# Patient Record
Sex: Male | Born: 1970 | Race: White | Hispanic: No | Marital: Single | Smoking: Never smoker
Health system: Southern US, Community
[De-identification: ages and names within clinical notes are randomized; demographics above are authoritative.]

## PROBLEM LIST (undated history)

## (undated) DIAGNOSIS — I10 Essential (primary) hypertension: Secondary | ICD-10-CM

## (undated) DIAGNOSIS — G8929 Other chronic pain: Secondary | ICD-10-CM

## (undated) DIAGNOSIS — M549 Dorsalgia, unspecified: Secondary | ICD-10-CM

---

## 2009-04-23 ENCOUNTER — Emergency Department: Payer: Self-pay | Admitting: Emergency Medicine

## 2009-06-12 ENCOUNTER — Emergency Department: Payer: Self-pay | Admitting: Internal Medicine

## 2011-08-15 ENCOUNTER — Emergency Department: Payer: Self-pay | Admitting: Emergency Medicine

## 2011-08-24 ENCOUNTER — Emergency Department: Payer: Self-pay | Admitting: Emergency Medicine

## 2012-08-01 ENCOUNTER — Emergency Department: Payer: Self-pay | Admitting: Emergency Medicine

## 2012-08-07 ENCOUNTER — Emergency Department: Payer: Self-pay | Admitting: Emergency Medicine

## 2013-05-17 ENCOUNTER — Emergency Department: Payer: Self-pay | Admitting: Internal Medicine

## 2013-06-10 ENCOUNTER — Emergency Department: Payer: Self-pay | Admitting: Emergency Medicine

## 2013-08-12 ENCOUNTER — Emergency Department: Payer: Self-pay | Admitting: Internal Medicine

## 2013-08-23 ENCOUNTER — Emergency Department (HOSPITAL_COMMUNITY)
Admission: EM | Admit: 2013-08-23 | Discharge: 2013-08-23 | Disposition: A | Discharge status: Home / Self Care | Payer: Self-pay | Attending: Emergency Medicine | Admitting: Emergency Medicine

## 2013-08-23 ENCOUNTER — Emergency Department (HOSPITAL_COMMUNITY): Payer: Self-pay

## 2013-08-23 ENCOUNTER — Encounter (HOSPITAL_COMMUNITY): Payer: Self-pay | Admitting: Emergency Medicine

## 2013-08-23 DIAGNOSIS — G8929 Other chronic pain: Secondary | ICD-10-CM | POA: Insufficient documentation

## 2013-08-23 DIAGNOSIS — M47816 Spondylosis without myelopathy or radiculopathy, lumbar region: Secondary | ICD-10-CM

## 2013-08-23 DIAGNOSIS — M47817 Spondylosis without myelopathy or radiculopathy, lumbosacral region: Secondary | ICD-10-CM | POA: Insufficient documentation

## 2013-08-23 HISTORY — DX: Dorsalgia, unspecified: M54.9

## 2013-08-23 HISTORY — DX: Other chronic pain: G89.29

## 2013-08-23 MED ORDER — DIAZEPAM 5 MG PO TABS
5.0000 mg | ORAL_TABLET | Freq: Once | ORAL | Status: AC
  Administered 2013-08-23: 5 mg via ORAL
  Filled 2013-08-23: qty 1

## 2013-08-23 MED ORDER — BACLOFEN 10 MG PO TABS: 10.0000 mg | ORAL_TABLET | Freq: Three times a day (TID) | ORAL | Status: AC

## 2013-08-23 MED ORDER — DICLOFENAC SODIUM 75 MG PO TBEC: 75.0000 mg | DELAYED_RELEASE_TABLET | Freq: Two times a day (BID) | ORAL | Status: DC

## 2013-08-23 MED ORDER — KETOROLAC TROMETHAMINE 10 MG PO TABS
10.0000 mg | ORAL_TABLET | Freq: Once | ORAL | Status: AC
  Administered 2013-08-23: 10 mg via ORAL
  Filled 2013-08-23: qty 1

## 2013-08-23 MED ORDER — PREDNISONE 50 MG PO TABS
60.0000 mg | ORAL_TABLET | Freq: Once | ORAL | Status: AC
  Administered 2013-08-23: 60 mg via ORAL
  Filled 2013-08-23 (×2): qty 1

## 2013-08-23 MED ORDER — DEXAMETHASONE 4 MG PO TABS: ORAL_TABLET | ORAL | Status: DC

## 2013-08-23 NOTE — Discharge Instructions (Signed)
The x-ray of your lower back reveals changes of the disc space, as well as arthritis being present. Your examination reveals some mild to moderate spasm in various areas of your lower back. Please rest your back is much as possible. Please apply heat to your lower back. Please use medications as prescribed. Baclofen may cause drowsiness, please use with caution. Please take these medications with food. Please see the physicians at the adult medicine clinic to provide medical assistance until you can establish a primary physician.

## 2013-08-23 NOTE — ED Provider Notes (Signed)
CSN: [id number]     Arrival date & time 08/23/13  1920 History   First MD Initiated Contact with Patient 08/23/13 2153     Chief Complaint  Patient presents with  . Back Pain   (Consider location/radiation/quality/duration/timing/severity/associated sxs/prior Treatment) Patient is a 43 y.o. male presenting with back pain. The history is provided by the patient.  Back Pain Location:  Lumbar spine Quality:  Aching Associated symptoms: no abdominal pain, no chest pain and no dysuria     Past Medical History  Diagnosis Date  . Chronic back pain    History reviewed. No pertinent past surgical history. History reviewed. No pertinent family history. History  Substance Use Topics  . Smoking status: Never Smoker   . Smokeless tobacco: Not on file  . Alcohol Use: Yes     Comment: occasional    Review of Systems  Constitutional: Negative for activity change.       All ROS Neg except as noted in HPI  HENT: Negative for nosebleeds.   Eyes: Negative for photophobia and discharge.  Respiratory: Negative for cough, shortness of breath and wheezing.   Cardiovascular: Negative for chest pain and palpitations.  Gastrointestinal: Negative for abdominal pain and blood in stool.  Genitourinary: Negative for dysuria, frequency and hematuria.  Musculoskeletal: Positive for back pain. Negative for arthralgias and neck pain.  Skin: Negative.   Neurological: Negative for dizziness, seizures and speech difficulty.  Psychiatric/Behavioral: Negative for hallucinations and confusion.    Allergies  Review of patient's allergies indicates no known allergies.  Home Medications   Current Outpatient Rx  Name  Route  Sig  Dispense  Refill  . traMADol (ULTRAM) 50 MG tablet   Oral   Take 50 mg by mouth every 6 (six) hours as needed.          BP 146/60  Pulse 83  Temp(Src) 98.2 F (36.8 C)  Resp 20  Ht 5\' 9"  (1.753 m)  Wt 200 lb (90.719 kg)  BMI 29.52 kg/m2  SpO2 99% Physical Exam  Nursing  note and vitals reviewed. Constitutional: He is oriented to person, place, and time. He appears well-developed and well-nourished.  Non-toxic appearance.  HENT:  Head: Normocephalic.  Right Ear: Tympanic membrane and external ear normal.  Left Ear: Tympanic membrane and external ear normal.  Eyes: EOM and lids are normal. Pupils are equal, round, and reactive to light.  Neck: Normal range of motion. Neck supple. Carotid bruit is not present.  Cardiovascular: Normal rate, regular rhythm, normal heart sounds, intact distal pulses and normal pulses.   Pulmonary/Chest: Breath sounds normal. No respiratory distress.  Abdominal: Soft. Bowel sounds are normal. There is no tenderness. There is no guarding.  Musculoskeletal:       Lumbar back: He exhibits decreased range of motion, tenderness and spasm.  Lymphadenopathy:       Head (right side): No submandibular adenopathy present.       Head (left side): No submandibular adenopathy present.    He has no cervical adenopathy.  Neurological: He is alert and oriented to person, place, and time. He has normal strength. No cranial nerve deficit or sensory deficit.  Skin: Skin is warm and dry.  Psychiatric: He has a normal mood and affect. His speech is normal.    ED Course  Procedures (including critical care time) Labs Review Labs Reviewed - No data to display Imaging Review Dg Lumbar Spine Complete  08/23/2013   CLINICAL DATA:  Low back pain  EXAM: LUMBAR  SPINE - COMPLETE 4+ VIEW  COMPARISON:  August 12, 2013  FINDINGS: Frontal, lateral, spot lumbosacral lateral, and bilateral oblique views were obtained. There are 5 non-rib-bearing lumbar type vertebral bodies. There is no fracture or spondylolisthesis. There is moderate disc space narrowing at L5-S1. Other disc spaces appear intact. There is facet osteoarthritic change at L5-S1 bilaterally.  IMPRESSION: Osteoarthritic change at L5-S1.  No fracture or spondylolisthesis.   Electronically Signed   By:  Bretta Bang M.D.   On: 08/23/2013 21:45    EKG Interpretation   None       MDM  No diagnosis found. *I have reviewed nursing notes, vital signs, and all appropriate lab and imaging results for this patient.**  Xray of the lumbar spine reveals narrowing at the L5S1 area, with facet osteoarthritis. Plan - Rx for baclofen, decadron, and voltaren. Pt to follow up with PCP for management of this problem. Test results and plan discussed with the patient.  Kathie Dike, PA-C 08/25/13 1730

## 2013-08-23 NOTE — ED Notes (Addendum)
Pt reporting pain in lower back moving up right side and into neck.  Reports long history of pain, worse in past few days.  Reports being told that he had sciatica and a bone spur at [facility] a couple weeks ago.  Reports he was given ultram and an antiinflammatory and denies relief from either.

## 2013-08-28 NOTE — ED Provider Notes (Signed)
Medical screening examination/treatment/procedure(s) were performed by non-physician practitioner and as supervising physician I was immediately available for consultation/collaboration.  EKG Interpretation   None         Chakia Counts L Genora Arp, MD 08/28/13 1534 

## 2013-10-29 LAB — ETHANOL: Ethanol %: <0.003 % (ref 0.000–0.080)

## 2013-10-29 LAB — COMPREHENSIVE METABOLIC PANEL
ALBUMIN: 4.5 g/dL (ref 3.4–5.0)
ALK PHOS: 92 U/L
Anion Gap: 6 — ABNORMAL LOW (ref 7–16)
BILIRUBIN TOTAL: 0.5 mg/dL (ref 0.2–1.0)
BUN: 12 mg/dL (ref 7–18)
CALCIUM: 9.2 mg/dL (ref 8.5–10.1)
CREATININE: 0.9 mg/dL (ref 0.60–1.30)
Chloride: 108 mmol/L — ABNORMAL HIGH (ref 98–107)
Co2: 27 mmol/L (ref 21–32)
EGFR (African American): >60
EGFR (Non-African Amer.): >60
Glucose: 99 mg/dL (ref 65–99)
OSMOLALITY: 281 (ref 275–301)
POTASSIUM: 4.2 mmol/L (ref 3.5–5.1)
SGOT(AST): 20 U/L (ref 15–37)
SGPT (ALT): 39 U/L (ref 12–78)
Sodium: 141 mmol/L (ref 136–145)
TOTAL PROTEIN: 8.3 g/dL — AB (ref 6.4–8.2)

## 2013-10-29 LAB — CBC
HCT: 49.4 % (ref 40.0–52.0)
HGB: 16.4 g/dL (ref 13.0–18.0)
MCH: 31.8 pg (ref 26.0–34.0)
MCHC: 33.2 g/dL (ref 32.0–36.0)
MCV: 96 fL (ref 80–100)
Platelet: 233 10*3/uL (ref 150–440)
RBC: 5.17 10*6/uL (ref 4.40–5.90)
RDW: 13.9 % (ref 11.5–14.5)
WBC: 8 10*3/uL (ref 3.8–10.6)

## 2013-10-29 LAB — URINALYSIS, COMPLETE
BACTERIA: NONE SEEN
Bilirubin,UR: NEGATIVE
Glucose,UR: NEGATIVE mg/dL (ref 0–75)
Ketone: NEGATIVE
LEUKOCYTE ESTERASE: NEGATIVE
Nitrite: NEGATIVE
Ph: 5 (ref 4.5–8.0)
Protein: NEGATIVE
RBC,UR: <1 /HPF (ref 0–5)
SPECIFIC GRAVITY: 1.02 (ref 1.003–1.030)
Squamous Epithelial: NONE SEEN
WBC UR: 5 /HPF (ref 0–5)

## 2013-10-29 LAB — DRUG SCREEN, URINE
Amphetamines, Ur Screen: NEGATIVE (ref ?–1000)
Barbiturates, Ur Screen: NEGATIVE (ref ?–200)
Benzodiazepine, Ur Scrn: POSITIVE (ref ?–200)
CANNABINOID 50 NG, UR ~~LOC~~: POSITIVE (ref ?–50)
Cocaine Metabolite,Ur ~~LOC~~: NEGATIVE (ref ?–300)
MDMA (Ecstasy)Ur Screen: NEGATIVE (ref ?–500)
Methadone, Ur Screen: NEGATIVE (ref ?–300)
Opiate, Ur Screen: NEGATIVE (ref ?–300)
Phencyclidine (PCP) Ur S: NEGATIVE (ref ?–25)
TRICYCLIC, UR SCREEN: NEGATIVE (ref ?–1000)

## 2013-10-29 LAB — SALICYLATE LEVEL: SALICYLATES, SERUM: 3.2 mg/dL — AB

## 2013-10-29 LAB — ACETAMINOPHEN LEVEL

## 2013-10-30 ENCOUNTER — Inpatient Hospital Stay: Payer: Self-pay | Admitting: Psychiatry

## 2013-12-31 ENCOUNTER — Emergency Department: Payer: Self-pay | Admitting: Emergency Medicine

## 2014-07-17 ENCOUNTER — Encounter (HOSPITAL_COMMUNITY): Payer: Self-pay | Admitting: Emergency Medicine

## 2014-07-17 ENCOUNTER — Emergency Department (HOSPITAL_COMMUNITY)
Admission: EM | Admit: 2014-07-17 | Discharge: 2014-07-17 | Disposition: A | Discharge status: Home / Self Care | Payer: Self-pay | Attending: Emergency Medicine | Admitting: Emergency Medicine

## 2014-07-17 DIAGNOSIS — M25511 Pain in right shoulder: Secondary | ICD-10-CM | POA: Insufficient documentation

## 2014-07-17 DIAGNOSIS — Z791 Long term (current) use of non-steroidal anti-inflammatories (NSAID): Secondary | ICD-10-CM | POA: Insufficient documentation

## 2014-07-17 DIAGNOSIS — M62838 Other muscle spasm: Secondary | ICD-10-CM

## 2014-07-17 DIAGNOSIS — G8929 Other chronic pain: Secondary | ICD-10-CM | POA: Insufficient documentation

## 2014-07-17 DIAGNOSIS — M5412 Radiculopathy, cervical region: Secondary | ICD-10-CM | POA: Insufficient documentation

## 2014-07-17 DIAGNOSIS — G8911 Acute pain due to trauma: Secondary | ICD-10-CM | POA: Insufficient documentation

## 2014-07-17 DIAGNOSIS — Z7952 Long term (current) use of systemic steroids: Secondary | ICD-10-CM | POA: Insufficient documentation

## 2014-07-17 DIAGNOSIS — M62830 Muscle spasm of back: Secondary | ICD-10-CM | POA: Insufficient documentation

## 2014-07-17 MED ORDER — CYCLOBENZAPRINE HCL 10 MG PO TABS: 10.0000 mg | ORAL_TABLET | Freq: Two times a day (BID) | ORAL | Status: DC | PRN

## 2014-07-17 MED ORDER — OXYCODONE-ACETAMINOPHEN 5-325 MG PO TABS: 2.0000 | ORAL_TABLET | Freq: Four times a day (QID) | ORAL | Status: DC | PRN

## 2014-07-17 MED ORDER — NAPROXEN 250 MG PO TABS
500.0000 mg | ORAL_TABLET | Freq: Once | ORAL | Status: AC
  Administered 2014-07-17: 500 mg via ORAL
  Filled 2014-07-17: qty 2

## 2014-07-17 MED ORDER — NAPROXEN 500 MG PO TABS: 500.0000 mg | ORAL_TABLET | Freq: Two times a day (BID) | ORAL | Status: DC

## 2014-07-17 MED ORDER — OXYCODONE-ACETAMINOPHEN 5-325 MG PO TABS
2.0000 | ORAL_TABLET | Freq: Once | ORAL | Status: AC
  Administered 2014-07-17: 2 via ORAL
  Filled 2014-07-17: qty 2

## 2014-07-17 NOTE — ED Notes (Signed)
Patient placed on 5 lead 

## 2014-07-17 NOTE — Discharge Instructions (Signed)
Call an orthopedic specialist for further evaluation of your shoulder and neck pain. Call for a follow up appointment with a Family or Primary Care Provider.  Return if Symptoms worsen.   Take medication as prescribed.  Ice and warm compresses intermittently to decrease symptoms. Do not operate heavy machinery, drink alcohol while taking narcotic or muscle relaxant medications.

## 2014-07-17 NOTE — ED Notes (Signed)
Pt c/o right shoulder pain and neck pain x 3 weeks after injuring while lifting heavy furniture; pt sts some tingling in fingers; CMS intact

## 2014-07-17 NOTE — ED Provider Notes (Signed)
CSN: [id number]     Arrival date & time 07/17/14  [id number] History   First MD Initiated Contact with Patient 07/17/14 1017     Chief Complaint  Patient presents with  . Shoulder Pain  . Neck Pain     (Consider location/radiation/quality/duration/timing/severity/associated sxs/prior Treatment) HPI Comments: The patient is a 43 year old male presents emergency room chief complaint of right neck, shoulder and arm discomfort for 2 weeks. Patient reports pain worsened with movement. He reports occasional numbness into the right upper extremity. Patient is right-hand dominant. Patient denies dropping anything due to numbness or weakness. Patient reports taken anti-inflammatories without relief.  Patient also reports history of right shoulder injury, with crepitus increase in pain with movement, has not seen orthopedist. Patient reports knot in right upper back.  The history is provided by the patient. No language interpreter was used.    Past Medical History  Diagnosis Date  . Chronic back pain    History reviewed. No pertinent past surgical history. History reviewed. No pertinent family history. History  Substance Use Topics  . Smoking status: Never Smoker   . Smokeless tobacco: Not on file  . Alcohol Use: Yes     Comment: occasional    Review of Systems  Musculoskeletal: Positive for arthralgias, neck pain and neck stiffness.  Neurological: Positive for numbness.      Allergies  Review of patient's allergies indicates no known allergies.  Home Medications   Prior to Admission medications   Medication Sig Start Date End Date Taking? Authorizing Provider  dexamethasone (DECADRON) 4 MG tablet 1 po bid with food 08/23/13   Kathie DikeHobson M Bryant, PA-C  diclofenac (VOLTAREN) 75 MG EC tablet Take 1 tablet (75 mg total) by mouth 2 (two) times daily. 08/23/13   Kathie DikeHobson M Bryant, PA-C  traMADol (ULTRAM) 50 MG tablet Take 50 mg by mouth every 6 (six) hours as needed.    Historical Provider, MD    BP 120/58 mmHg  Pulse 65  Temp(Src) 97.8 F (36.6 C) (Oral)  Resp 19  SpO2 96% Physical Exam  Constitutional: He is oriented to person, place, and time. He appears well-developed and well-nourished. No distress.  HENT:  Head: Normocephalic and atraumatic.  Neck: Neck supple.  Cardiovascular:  Pulses:      Radial pulses are 2+ on the right side, and 2+ on the left side.  Pulmonary/Chest: Effort normal. No respiratory distress.  Musculoskeletal:       Cervical back: He exhibits tenderness and spasm.       Back:  Right upper trap with tenderness with palpation, spasm noted. Palpation of right trapezius greatest discomfort in upper extremity. Positive Spurling's.  Neurological: He is oriented to person, place, and time.  Reflex Scores:      Bicep reflexes are 2+ on the right side and 2+ on the left side. Equal sensation in bilateral upper extremities.  Skin: Skin is warm and dry.  Psychiatric: He has a normal mood and affect. His behavior is normal.  Nursing note and vitals reviewed.   ED Course  Procedures (including critical care time) Labs Review Labs Reviewed - No data to display  Imaging Review No results found.   EKG Interpretation None      MDM   Final diagnoses:  Cervical radiculopathy  Muscle spasm   Patient presents with right neck and right shoulder pain, reproducible with palpation likely spasm and likely radicular pain. Plan to treat with anti-inflammatory, narcotic pain medication, muscle relaxer, ortho follow-up.  Meds given  in ED:  Medications  naproxen (NAPROSYN) tablet 500 mg (500 mg Oral Given 07/17/14 1036)  oxyCODONE-acetaminophen (PERCOCET/ROXICET) 5-325 MG per tablet 2 tablet (2 tablets Oral Given 07/17/14 1036)    Discharge Medication List as of 07/17/2014 10:55 AM    START taking these medications   Details  cyclobenzaprine (FLEXERIL) 10 MG tablet Take 1 tablet (10 mg total) by mouth 2 (two) times daily as needed for muscle  spasms., Starting 07/17/2014, Until Discontinued, Print    naproxen (NAPROSYN) 500 MG tablet Take 1 tablet (500 mg total) by mouth 2 (two) times daily with a meal., Starting 07/17/2014, Until Discontinued, Print    oxyCODONE-acetaminophen (PERCOCET/ROXICET) 5-325 MG per tablet Take 2 tablets by mouth every 6 (six) hours as needed for severe pain., Starting 07/17/2014, Until Discontinued, Print         Mellody DrownLauren Kyira Volkert, PA-C 07/17/14 1501  Flint MelterElliott L Wentz, MD 07/17/14 [phone]

## 2014-07-17 NOTE — ED Notes (Signed)
Pt comfortable with discharge and follow up instructions. Pt declines wheelchair, escorted to waiting area by this RN. Prescriptions x3. 
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
## 2015-11-12 ENCOUNTER — Emergency Department
Admission: EM | Admit: 2015-11-12 | Discharge: 2015-11-12 | Disposition: A | Discharge status: Home / Self Care | Payer: Self-pay | Attending: Emergency Medicine | Admitting: Emergency Medicine

## 2015-11-12 ENCOUNTER — Emergency Department: Payer: Self-pay

## 2015-11-12 ENCOUNTER — Encounter: Payer: Self-pay | Admitting: *Deleted

## 2015-11-12 DIAGNOSIS — Y999 Unspecified external cause status: Secondary | ICD-10-CM | POA: Insufficient documentation

## 2015-11-12 DIAGNOSIS — S62335A Displaced fracture of neck of fourth metacarpal bone, left hand, initial encounter for closed fracture: Secondary | ICD-10-CM | POA: Insufficient documentation

## 2015-11-12 DIAGNOSIS — Y929 Unspecified place or not applicable: Secondary | ICD-10-CM | POA: Insufficient documentation

## 2015-11-12 DIAGNOSIS — Y939 Activity, unspecified: Secondary | ICD-10-CM | POA: Insufficient documentation

## 2015-11-12 DIAGNOSIS — S62339A Displaced fracture of neck of unspecified metacarpal bone, initial encounter for closed fracture: Secondary | ICD-10-CM

## 2015-11-12 DIAGNOSIS — Z791 Long term (current) use of non-steroidal anti-inflammatories (NSAID): Secondary | ICD-10-CM | POA: Insufficient documentation

## 2015-11-12 MED ORDER — MELOXICAM 15 MG PO TABS: 15.0000 mg | ORAL_TABLET | Freq: Every day | ORAL | Status: DC

## 2015-11-12 MED ORDER — TRAMADOL HCL 50 MG PO TABS
50.0000 mg | ORAL_TABLET | Freq: Four times a day (QID) | ORAL | Status: DC | PRN
Start: 2015-11-12 — End: 2018-02-25
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
## 2015-11-12 NOTE — Discharge Instructions (Signed)
It is very important that you call and schedule a follow up appointment with a hand surgeon as soon as possible.
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
## 2015-11-12 NOTE — ED Notes (Signed)
Pt discharged home after verbalizing understanding of discharge instructions; nad noted. 
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
## 2016-05-05 ENCOUNTER — Emergency Department
Admission: EM | Admit: 2016-05-05 | Discharge: 2016-05-05 | Disposition: A | Discharge status: Home / Self Care | Payer: Self-pay | Attending: Emergency Medicine | Admitting: Emergency Medicine

## 2016-05-05 ENCOUNTER — Emergency Department: Payer: Self-pay

## 2016-05-05 DIAGNOSIS — R519 Headache, unspecified: Secondary | ICD-10-CM

## 2016-05-05 DIAGNOSIS — R51 Headache: Secondary | ICD-10-CM | POA: Insufficient documentation

## 2016-05-05 DIAGNOSIS — R112 Nausea with vomiting, unspecified: Secondary | ICD-10-CM | POA: Insufficient documentation

## 2016-05-05 DIAGNOSIS — Z791 Long term (current) use of non-steroidal anti-inflammatories (NSAID): Secondary | ICD-10-CM | POA: Insufficient documentation

## 2016-05-05 MED ORDER — BUTALBITAL-APAP-CAFFEINE 50-325-40 MG PO TABS
1.0000 | ORAL_TABLET | Freq: Once | ORAL | Status: AC
  Administered 2016-05-05: 1 via ORAL
  Filled 2016-05-05: qty 1

## 2016-05-05 MED ORDER — BUTALBITAL-APAP-CAFFEINE 50-325-40 MG PO TABS
1.0000 | ORAL_TABLET | Freq: Four times a day (QID) | ORAL | 0 refills | Status: AC | PRN
Start: 2016-05-05 — End: 2017-05-05

## 2016-05-05 NOTE — ED Triage Notes (Signed)
Pt c/o HA with N/V for the past 4 days.. States he has been taking OTC tylenol, aleve and goody powders with no relief..Marland Kitchen

## 2016-05-05 NOTE — Discharge Instructions (Signed)
Please seek medical attention for any high fevers, chest pain, shortness of breath, change in behavior, persistent vomiting, bloody stool or any other new or concerning symptoms.  

## 2016-05-05 NOTE — ED Notes (Signed)
Pt alert and oriented X4, active, cooperative, pt in NAD. RR even and unlabored, color WNL.  Pt informed to return if any life threatening symptoms occur.   

## 2016-05-05 NOTE — ED Provider Notes (Signed)
[facility] Emergency Department Provider Note    _____________________[facility]_______________________   I have reviewed the triage vital signs and the nursing notes.   HISTORY  Chief Complaint Headache; Nausea; and Emesis   History limited by: Not Limited   HPI Kevin Holt is a 45 y.o. male who presents to the emergency department today because of concern for headache. The patient states that it started four days ago and has progressively gotten worse. It is located in the back of his head with some radiation up the right side. He has tried medication without relief. He feels light sensitivity. Additionally he has had some nausea and a couple of episodes of vomiting. No fevers.   Past Medical History:  Diagnosis Date  . Chronic back pain     There are no active problems to display for this patient.   History reviewed. No pertinent surgical history.  Prior to Admission medications   Medication Sig Start Date End Date Taking? Authorizing Provider  acetaminophen (TYLENOL) 500 MG tablet Take 1,000 mg by mouth every 6 (six) hours as needed for mild pain or moderate pain.    Historical Provider, MD  cyclobenzaprine (FLEXERIL) 10 MG tablet Take 1 tablet (10 mg total) by mouth 2 (two) times daily as needed for muscle spasms. 07/17/14   Mellody DrownLauren Parker, PA-C  meloxicam (MOBIC) 15 MG tablet Take 1 tablet (15 mg total) by mouth daily. 11/12/15   Chinita Pesterari B Triplett, FNP  naproxen (NAPROSYN) 500 MG tablet Take 1 tablet (500 mg total) by mouth 2 (two) times daily with a meal. 07/17/14   Mellody DrownLauren Parker, PA-C  oxyCODONE-acetaminophen (PERCOCET/ROXICET) 5-325 MG per tablet Take 2 tablets by mouth every 6 (six) hours as needed for severe pain. 07/17/14   Mellody DrownLauren Parker, PA-C  traMADol (ULTRAM) 50 MG tablet Take 1 tablet (50 mg total) by mouth every 6 (six) hours as needed. 11/12/15   Chinita Pesterari B Triplett, FNP    Allergies Tramadol  No family history on file.  Social History Social  History  Substance Use Topics  . Smoking status: Never Smoker  . Smokeless tobacco: Never Used  . Alcohol use Yes     Comment: occasional    Review of Systems  Constitutional: Negative for fever. Cardiovascular: Negative for chest pain. Respiratory: Negative for shortness of breath. Gastrointestinal: Negative for abdominal pain, vomiting and diarrhea. Genitourinary: Negative for dysuria. Musculoskeletal: Negative for back pain. Skin: Negative for rash. Neurological: Positive for headaches.  10-point ROS otherwise negative.  ____________________________________________   PHYSICAL EXAM:  VITAL SIGNS: ED Triage Vitals [05/05/16 1213]  Enc Vitals Group     BP 129/85     Pulse Rate 86     Resp 18     Temp 97.7 F (36.5 C)     Temp Source Oral     SpO2 97 %     Weight 210 lb (95.3 kg)     Height 5\' 9"  (1.753 m)     Head Circumference      Peak Flow      Pain Score 10   Constitutional: Alert and oriented. Well appearing and in no distress. Eyes: Conjunctivae are normal. Normal extraocular movements. ENT   Head: Normocephalic and atraumatic.   Nose: No congestion/rhinnorhea.   Mouth/Throat: Mucous membranes are moist.   Neck: No stridor. Hematological/Lymphatic/Immunilogical: No cervical lymphadenopathy. Cardiovascular: Normal rate, regular rhythm.  No murmurs, rubs, or gallops.  Respiratory: Normal respiratory effort without tachypnea nor retractions. Breath sounds are clear and equal  bilaterally. No wheezes/rales/rhonchi. Gastrointestinal: Soft and nontender. No distention.  Genitourinary: Deferred Musculoskeletal: Normal range of motion in all extremities. No lower extremity edema. Neurologic:  Normal speech and language. No gross focal neurologic deficits are appreciated.  Skin:  Skin is warm, dry and intact. No rash noted. Psychiatric: Mood and affect are normal. Speech and behavior are normal. Patient exhibits appropriate insight and  judgment.  ____________________________________________    LABS (pertinent positives/negatives)  Labs Reviewed - No data to display   ____________________________________________   EKG  None  ____________________________________________    RADIOLOGY  CT head IMPRESSION:  No acute finding.    Chronic appearing lacune in the right putamen.     ___________________________________________   PROCEDURES  Procedures  ____________________________________________   INITIAL IMPRESSION / ASSESSMENT AND PLAN / ED COURSE  Pertinent labs & imaging results that were available during my care of the patient were reviewed by me and considered in my medical decision making (see chart for details).  Patient with headache for four days. CT head without acute finding. Patient states that the medication did help take the edge off of the pain. Felt comfortable going home with prescription for pain medication.   ____________________________________________   FINAL CLINICAL IMPRESSION(S) / ED DIAGNOSES  Final diagnoses:  Nonintractable headache, unspecified chronicity pattern, unspecified headache type     Note: This dictation was prepared with Dragon dictation. Any transcriptional errors that result from this process are unintentional    Phineas Semen, MD 05/05/16 1945

## 2018-02-25 ENCOUNTER — Emergency Department
Admission: EM | Admit: 2018-02-25 | Discharge: 2018-02-25 | Disposition: A | Discharge status: Home / Self Care | Payer: No Typology Code available for payment source | Attending: Emergency Medicine | Admitting: Emergency Medicine

## 2018-02-25 ENCOUNTER — Emergency Department: Payer: No Typology Code available for payment source

## 2018-02-25 ENCOUNTER — Other Ambulatory Visit: Payer: Self-pay

## 2018-02-25 DIAGNOSIS — T300 Burn of unspecified body region, unspecified degree: Secondary | ICD-10-CM | POA: Insufficient documentation

## 2018-02-25 DIAGNOSIS — Y9389 Activity, other specified: Secondary | ICD-10-CM | POA: Diagnosis not present

## 2018-02-25 DIAGNOSIS — M7918 Myalgia, other site: Secondary | ICD-10-CM | POA: Diagnosis present

## 2018-02-25 DIAGNOSIS — Y998 Other external cause status: Secondary | ICD-10-CM | POA: Diagnosis not present

## 2018-02-25 DIAGNOSIS — Y92410 Unspecified street and highway as the place of occurrence of the external cause: Secondary | ICD-10-CM | POA: Insufficient documentation

## 2018-02-25 MED ORDER — SILVER SULFADIAZINE 1 % EX CREA
TOPICAL_CREAM | Freq: Once | CUTANEOUS | Status: AC
  Administered 2018-02-25: 13:00:00 via TOPICAL

## 2018-02-25 MED ORDER — OXYCODONE-ACETAMINOPHEN 5-325 MG PO TABS: 1.0000 | ORAL_TABLET | Freq: Three times a day (TID) | ORAL | 0 refills | Status: AC | PRN

## 2018-02-25 MED ORDER — BACITRACIN-NEOMYCIN-POLYMYXIN 400-5-5000 EX OINT
TOPICAL_OINTMENT | Freq: Once | CUTANEOUS | Status: AC
  Administered 2018-02-25: 13:00:00 via TOPICAL
  Filled 2018-02-25: qty 4

## 2018-02-25 MED ORDER — IBUPROFEN 600 MG PO TABS: 600.0000 mg | ORAL_TABLET | Freq: Four times a day (QID) | ORAL | 0 refills | Status: DC | PRN

## 2018-02-25 MED ORDER — CYCLOBENZAPRINE HCL 5 MG PO TABS: ORAL_TABLET | ORAL | 0 refills | Status: DC

## 2018-02-25 MED ORDER — OXYCODONE-ACETAMINOPHEN 5-325 MG PO TABS
1.0000 | ORAL_TABLET | Freq: Once | ORAL | Status: AC
  Administered 2018-02-25: 1 via ORAL
  Filled 2018-02-25: qty 1

## 2018-02-25 MED ORDER — SILVER SULFADIAZINE 1 % EX CREA
TOPICAL_CREAM | CUTANEOUS | Status: AC
  Filled 2018-02-25: qty 85

## 2018-02-25 MED ORDER — CEPHALEXIN 500 MG PO CAPS: 500.0000 mg | ORAL_CAPSULE | Freq: Four times a day (QID) | ORAL | 0 refills | Status: AC

## 2018-02-25 NOTE — Discharge Instructions (Addendum)
Please follow up with wound care next week for continued care of wounds. Please change dressings every 24 hours and keep wounds covered at all times.
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
## 2018-02-25 NOTE — ED Notes (Signed)
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
## 2019-01-08 ENCOUNTER — Emergency Department: Payer: Self-pay

## 2019-01-08 ENCOUNTER — Other Ambulatory Visit: Payer: Self-pay

## 2019-01-08 ENCOUNTER — Encounter: Payer: Self-pay | Admitting: Emergency Medicine

## 2019-01-08 ENCOUNTER — Emergency Department
Admission: EM | Admit: 2019-01-08 | Discharge: 2019-01-08 | Disposition: A | Discharge status: Home / Self Care | Payer: Self-pay | Attending: Emergency Medicine | Admitting: Emergency Medicine

## 2019-01-08 DIAGNOSIS — R0602 Shortness of breath: Secondary | ICD-10-CM | POA: Insufficient documentation

## 2019-01-08 DIAGNOSIS — Z5321 Procedure and treatment not carried out due to patient leaving prior to being seen by health care provider: Secondary | ICD-10-CM | POA: Insufficient documentation

## 2019-01-08 DIAGNOSIS — R079 Chest pain, unspecified: Secondary | ICD-10-CM | POA: Insufficient documentation

## 2019-01-08 HISTORY — DX: Essential (primary) hypertension: I10

## 2019-01-08 LAB — CBC
HCT: 50.8 % (ref 39.0–52.0)
Hemoglobin: 17.8 g/dL — ABNORMAL HIGH (ref 13.0–17.0)
MCH: 32.1 pg (ref 26.0–34.0)
MCHC: 35 g/dL (ref 30.0–36.0)
MCV: 91.7 fL (ref 80.0–100.0)
Platelets: 270 10*3/uL (ref 150–400)
RBC: 5.54 MIL/uL (ref 4.22–5.81)
RDW: 12.7 % (ref 11.5–15.5)
WBC: 9.4 10*3/uL (ref 4.0–10.5)
nRBC: 0 % (ref 0.0–0.2)

## 2019-01-08 LAB — BASIC METABOLIC PANEL
Anion gap: 13 (ref 5–15)
BUN: 12 mg/dL (ref 6–20)
CO2: 18 mmol/L — ABNORMAL LOW (ref 22–32)
Calcium: 9.2 mg/dL (ref 8.9–10.3)
Chloride: 107 mmol/L (ref 98–111)
Creatinine, Ser: 0.83 mg/dL (ref 0.61–1.24)
GFR calc Af Amer: >60 mL/min (ref >60)
GFR calc non Af Amer: >60 mL/min (ref >60)
Glucose, Bld: 115 mg/dL — ABNORMAL HIGH (ref 70–99)
Potassium: 3.6 mmol/L (ref 3.5–5.1)
Sodium: 138 mmol/L (ref 135–145)

## 2019-01-08 LAB — TROPONIN I: Troponin I: <0.03 ng/mL (ref <0.03)

## 2019-01-08 MED ORDER — SODIUM CHLORIDE 0.9% FLUSH: 3.0000 mL | Freq: Once | INTRAVENOUS | Status: DC

## 2019-01-08 NOTE — ED Notes (Signed)
Called for CXR.  No answer.

## 2019-01-08 NOTE — ED Triage Notes (Signed)
C/O CP x 1 day.  Feeling fatigued x 1 week.

## 2019-01-09 ENCOUNTER — Telehealth: Payer: Self-pay | Admitting: Emergency Medicine

## 2019-01-09 NOTE — Telephone Encounter (Signed)
Called patient due to lwot to inquire about condition and follow up plans.left message with family member to call me.

## 2020-06-05 ENCOUNTER — Other Ambulatory Visit: Payer: Self-pay

## 2020-06-05 ENCOUNTER — Emergency Department
Admission: EM | Admit: 2020-06-05 | Discharge: 2020-06-05 | Disposition: A | Discharge status: Home / Self Care | Payer: Self-pay | Attending: Emergency Medicine | Admitting: Emergency Medicine

## 2020-06-05 DIAGNOSIS — S60552A Superficial foreign body of left hand, initial encounter: Secondary | ICD-10-CM | POA: Insufficient documentation

## 2020-06-05 DIAGNOSIS — S6992XA Unspecified injury of left wrist, hand and finger(s), initial encounter: Secondary | ICD-10-CM

## 2020-06-05 DIAGNOSIS — I10 Essential (primary) hypertension: Secondary | ICD-10-CM | POA: Insufficient documentation

## 2020-06-05 DIAGNOSIS — W458XXA Other foreign body or object entering through skin, initial encounter: Secondary | ICD-10-CM | POA: Insufficient documentation

## 2020-06-05 MED ORDER — CEPHALEXIN 500 MG PO CAPS: 500.0000 mg | ORAL_CAPSULE | Freq: Three times a day (TID) | ORAL | 0 refills | Status: AC

## 2020-06-05 MED ORDER — LIDOCAINE HCL (PF) 1 % IJ SOLN
5.0000 mL | Freq: Once | INTRAMUSCULAR | Status: AC
  Administered 2020-06-05: 5 mL
  Filled 2020-06-05: qty 5

## 2020-06-05 MED ORDER — CEPHALEXIN 500 MG PO CAPS
500.0000 mg | ORAL_CAPSULE | Freq: Once | ORAL | Status: AC
  Administered 2020-06-05: 500 mg via ORAL
  Filled 2020-06-05: qty 1

## 2020-06-05 MED ORDER — SULFAMETHOXAZOLE-TRIMETHOPRIM 800-160 MG PO TABS
1.0000 | ORAL_TABLET | Freq: Once | ORAL | Status: AC
  Administered 2020-06-05: 1 via ORAL
  Filled 2020-06-05: qty 1

## 2020-06-05 MED ORDER — SULFAMETHOXAZOLE-TRIMETHOPRIM 800-160 MG PO TABS: 1.0000 | ORAL_TABLET | Freq: Two times a day (BID) | ORAL | 0 refills | Status: DC

## 2020-06-05 NOTE — Discharge Instructions (Signed)
We have successfully removed a fish hook from your hand. Keep the wound clean, dry, and covered. Take the antibiotics as directed. Return for any signs of infection.

## 2020-06-05 NOTE — ED Triage Notes (Signed)
Fish hook to palm of left hand.

## 2020-06-05 NOTE — ED Provider Notes (Signed)
[facility] Emergency Department Provider Note ____________________________________________  Time seen: 40  I have reviewed the triage vital signs and the nursing notes.  HISTORY  Chief Complaint  Foreign Body in Skin   HPI Kevin Holt is a 49 y.o. male presents to the ED for evaluation of an accidental injury resulting in a single barb fishhook being impaled in the patient is left palm.   Patient denies any other injury at this time.  He reports a current tetanus status.  Past Medical History:  Diagnosis Date  . Chronic back pain   . Hypertension     There are no problems to display for this patient.   History reviewed. No pertinent surgical history.  Prior to Admission medications   Medication Sig Start Date End Date Taking? Authorizing Provider  cyclobenzaprine (FLEXERIL) 5 MG tablet Take 1-2 tablets 3 times daily as needed 02/25/18   Enid Derry, PA-C  ibuprofen (ADVIL,MOTRIN) 600 MG tablet Take 1 tablet (600 mg total) by mouth every 6 (six) hours as needed. 02/25/18   Enid Derry, PA-C    Allergies Tramadol  History reviewed. No pertinent family history.  Social History Social History   Tobacco Use  . Smoking status: Never Smoker  . Smokeless tobacco: Never Used  Substance Use Topics  . Alcohol use: Yes    Comment: occasional  . Drug use: Yes    Types: Marijuana    Review of Systems  Constitutional: Negative for fever. Cardiovascular: Negative for chest pain. Respiratory: Negative for shortness of breath. Gastrointestinal: Negative for abdominal pain, vomiting and diarrhea. Musculoskeletal: Negative for back pain.  Foreign body  skin: Negative for rash. Neurological: Negative for headaches, focal weakness or numbness. ____________________________________________  PHYSICAL EXAM:  VITAL SIGNS: ED Triage Vitals  Enc Vitals Group     BP 06/05/20 1405 132/83     Pulse Rate 06/05/20 1405 93     Resp 06/05/20 1405 16      Temp 06/05/20 1405 99.4 F (37.4 C)     Temp Source 06/05/20 1405 Oral     SpO2 06/05/20 1405 94 %     Weight 06/05/20 1406 215 lb (97.5 kg)     Height 06/05/20 1406 5\' 10"  (1.778 m)     Head Circumference --      Peak Flow --      Pain Score 06/05/20 1405 8     Pain Loc --      Pain Edu? --      Excl. in GC? --     Constitutional: Alert and oriented. Well appearing and in no distress. Head: Normocephalic and atraumatic. Eyes: Conjunctivae are normal. Normal extraocular movements Cardiovascular: Normal rate, regular rhythm. Normal distal pulses. Respiratory: Normal respiratory effort.  Musculoskeletal: Normal composite fist on the left hand.  Patient with a large single barb fishhook in the thenar prominence of the left hand.  No active bleeding is appreciated.  Nontender with normal range of motion in all extremities.  Neurologic:  Normal gross sensation.  Normal speech and language. No gross focal neurologic deficits are appreciated. Skin:  Skin is warm, dry and intact. No rash noted. ____________________________________________  PROCEDURES  Bactrim DS 1 PO Keflex 500 gm PO  .Foreign Body Removal  Date/Time: 06/05/2020 2:50 PM Performed by: 06/07/2020, PA-C Authorized by: Lissa Hoard, PA-C  Body area: skin General location: upper extremity Location details: left hand Anesthesia: local infiltration  Anesthesia: Local Anesthetic: lidocaine 1% without epinephrine Anesthetic  total: 3 mL  Sedation: Patient sedated: no  Patient restrained: no Patient cooperative: yes Complexity: simple 1 objects recovered. Objects recovered: large single barb fish hook Post-procedure assessment: foreign body removed Patient tolerance: patient tolerated the procedure well with no immediate complications   ____________________________________________  INITIAL IMPRESSION / ASSESSMENT AND PLAN / ED COURSE  Patient with ED evaluation of a retained foreign  body to left hand.  Patient presents with a large single barb fishhook in the palm of the left hand.  The fishhook was removed out difficulty and the patient discharged with prophylactic antibiotics.  Follow-up with primary provider return to the ED if needed.  Kevin Holt was evaluated in Emergency Department on 06/05/2020 for the symptoms described in the history of present illness. He was evaluated in the context of the global COVID-19 pandemic, which necessitated consideration that the patient might be at risk for infection with the SARS-CoV-2 virus that causes COVID-19. Institutional protocols and algorithms that pertain to the evaluation of patients at risk for COVID-19 are in a state of rapid change based on information released by regulatory bodies including the CDC and federal and state organizations. These policies and algorithms were followed during the patient's care in the ED. ____________________________________________  FINAL CLINICAL IMPRESSION(S) / ED DIAGNOSES  Final diagnoses:  Fish hook injury of hand, left, initial encounter      Lissa Hoard, PA-C 06/05/20 1541    Jene Every, MD 06/05/20 1616

## 2021-05-30 ENCOUNTER — Other Ambulatory Visit: Payer: Self-pay

## 2021-05-30 ENCOUNTER — Emergency Department
Admission: EM | Admit: 2021-05-30 | Discharge: 2021-05-30 | Disposition: A | Discharge status: Home / Self Care | Payer: Self-pay | Attending: Emergency Medicine | Admitting: Emergency Medicine

## 2021-05-30 DIAGNOSIS — Z20822 Contact with and (suspected) exposure to covid-19: Secondary | ICD-10-CM | POA: Insufficient documentation

## 2021-05-30 DIAGNOSIS — I10 Essential (primary) hypertension: Secondary | ICD-10-CM | POA: Insufficient documentation

## 2021-05-30 DIAGNOSIS — R6889 Other general symptoms and signs: Secondary | ICD-10-CM

## 2021-05-30 DIAGNOSIS — J0100 Acute maxillary sinusitis, unspecified: Secondary | ICD-10-CM

## 2021-05-30 LAB — RESP PANEL BY RT-PCR (FLU A&B, COVID) ARPGX2
Influenza A by PCR: NEGATIVE
Influenza B by PCR: NEGATIVE
SARS Coronavirus 2 by RT PCR: NEGATIVE

## 2021-05-30 MED ORDER — FLUTICASONE PROPIONATE 50 MCG/ACT NA SUSP: 2.0000 | Freq: Every day | NASAL | 2 refills | Status: AC

## 2021-05-30 MED ORDER — AZITHROMYCIN 250 MG PO TABS: ORAL_TABLET | ORAL | 0 refills | Status: AC

## 2021-05-30 NOTE — ED Notes (Signed)
See triage note  presents with sinus drainage and sinus pressure  states subjective fever yesterday  afebrile on arrival

## 2021-05-30 NOTE — ED Triage Notes (Signed)
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

## 2021-05-30 NOTE — Discharge Instructions (Signed)
Take your antibiotic as prescribed.  Your COVID/flu test will result in 2 to 3 hours.  You can see this result on [facility] MyChart and I will try to call you later this afternoon to give you the results. If ER positive for COVID you will need to stay home and quarantine for 5 days if vaccinated, 7 days if not. If you are positive for influenza you will need to remain home until you have not had a fever for 24 hours.

## 2021-05-30 NOTE — ED Provider Notes (Signed)
[facility] Emergency Department Provider Note  ____________________________________________   Event Date/Time   First MD Initiated Contact with Patient 05/30/21 [phone]     (approximate)  I have reviewed the triage vital signs and the nursing notes.   HISTORY  Chief Complaint Sinus Problem    HPI Kevin Holt is a 50 y.o. male presents emergency department complaining of sinus drainage and sinus pressure with fever yesterday.  States he did feel little achy.  Took a home COVID test which was negative.  No vomiting or diarrhea.  No chest pain or shortness of breath.  States his mucus is thick and green.  Past Medical History:  Diagnosis Date   Chronic back pain    Hypertension     There are no problems to display for this patient.   No past surgical history on file.  Prior to Admission medications   Medication Sig Start Date End Date Taking? Authorizing Provider  azithromycin (ZITHROMAX Z-PAK) 250 MG tablet 2 pills today then 1 pill a day for 4 days 05/30/21  Yes Devanny Palecek, Roselyn Bering, PA-C  fluticasone [facility]) 50 MCG/ACT nasal spray Place 2 sprays into both nostrils daily. 05/30/21 05/30/22 Yes Latrenda Irani, Roselyn Bering, PA-C    Allergies Tramadol  No family history on file.  Social History Social History   Tobacco Use   Smoking status: Never   Smokeless tobacco: Never  Substance Use Topics   Alcohol use: Yes    Comment: occasional   Drug use: Yes    Types: Marijuana    Review of Systems  Constitutional: Positive fever/chills Eyes: No visual changes. ENT: No sore throat.  Positive sinus pain or drainage with green mucus Respiratory: Denies cough Cardiovascular: Denies chest pain Gastrointestinal: Denies abdominal pain Genitourinary: Negative for dysuria. Musculoskeletal: Negative for back pain. Skin: Negative for rash. Psychiatric: no mood changes,     ____________________________________________   PHYSICAL EXAM:  VITAL SIGNS: ED  Triage Vitals  Enc Vitals Group     BP 05/30/21 0851 121/86     Pulse Rate 05/30/21 0851 82     Resp 05/30/21 0851 16     Temp 05/30/21 0851 97.9 F (36.6 C)     Temp Source 05/30/21 0851 Oral     SpO2 05/30/21 0851 96 %     Weight 05/30/21 0852 212 lb (96.2 kg)     Height 05/30/21 0852 5\' 9"  (1.753 m)     Head Circumference --      Peak Flow --      Pain Score 05/30/21 0851 7     Pain Loc --      Pain Edu? --      Excl. in GC? --     Constitutional: Alert and oriented. Well appearing and in no acute distress. Eyes: Conjunctivae are normal.  Head: Atraumatic.  Maxillary sinuses and frontal sinuses tender to palpation Nose: No congestion/rhinnorhea. Mouth/Throat: Mucous membranes are moist.  Throat appears normal Neck:  supple no lymphadenopathy noted Cardiovascular: Normal rate, regular rhythm. Heart sounds are normal Respiratory: Normal respiratory effort.  No retractions, lungs c t a  GU: deferred Musculoskeletal: FROM all extremities, warm and well perfused Neurologic:  Normal speech and language.  Skin:  Skin is warm, dry and intact. No rash noted. Psychiatric: Mood and affect are normal. Speech and behavior are normal.  ____________________________________________   LABS (all labs ordered are listed, but only abnormal results are displayed)  Labs Reviewed  RESP PANEL BY RT-PCR (FLU A&B,  COVID) [id number]   ____________________________________________   ____________________________________________  RADIOLOGY    ____________________________________________   PROCEDURES  Procedure(s) performed: No  Procedures    ____________________________________________   INITIAL IMPRESSION / ASSESSMENT AND PLAN / ED COURSE  Pertinent labs & imaging results that were available during my care of the patient were reviewed by me and considered in my medical decision making (see chart for details).   Patient's 50 year old male presents with URI symptoms.  See HPI.   Physical exam is consistent with sinusitis and possibly influenza.  Respiratory panel obtained.  Patient will check either MyChart or I will try to call him later this afternoon with test results.  However due to the thick green mucus we will go ahead and treat him with a Z-Pak for his sinus infection.  Over-the-counter Flonase.  Follow-up with his regular doctor if not improved in 3 days.  Return emergency C department if worsening  Respiratory panel is negative Tried to call the patient with his test results.  The phone number that he gave [place] it belongs to another person.  He was told to check his results on MyChart.    Kevin Holt was evaluated in Emergency Department on 05/30/2021 for the symptoms described in the history of present illness. He was evaluated in the context of the global COVID-19 pandemic, which necessitated consideration that the patient might be at risk for infection with the SARS-CoV-2 virus that causes COVID-19. Institutional protocols and algorithms that pertain to the evaluation of patients at risk for COVID-19 are in a state of rapid change based on information released by regulatory bodies including the CDC and federal and state organizations. These policies and algorithms were followed during the patient's care in the ED.    As part of my medical decision making, I reviewed the following data within the electronic MEDICAL RECORD NUMBER Nursing notes reviewed and incorporated, Labs reviewed , Old chart reviewed, Notes from prior ED visits, and [place] Controlled Substance Database  ____________________________________________   FINAL CLINICAL IMPRESSION(S) / ED DIAGNOSES  Final diagnoses:  Acute maxillary sinusitis, recurrence not specified  Flu-like symptoms      NEW MEDICATIONS STARTED DURING THIS VISIT:  New Prescriptions   AZITHROMYCIN (ZITHROMAX Z-PAK) 250 MG TABLET    2 pills today then 1 pill a day for 4 days   FLUTICASONE (FLONASE) 50 MCG/ACT NASAL SPRAY     Place 2 sprays into both nostrils daily.     Note:  This document was prepared using Dragon voice recognition software and may include unintentional dictation errors.    Faythe Ghee, PA-C 05/30/21 1200    Merwyn Katos, MD 05/30/21 [phone]

## 2022-03-16 ENCOUNTER — Emergency Department: Payer: Self-pay

## 2022-03-16 ENCOUNTER — Emergency Department
Admission: EM | Admit: 2022-03-16 | Discharge: 2022-03-16 | Disposition: A | Discharge status: Home / Self Care | Payer: Self-pay | Attending: Physician Assistant | Admitting: Physician Assistant

## 2022-03-16 ENCOUNTER — Other Ambulatory Visit: Payer: Self-pay

## 2022-03-16 ENCOUNTER — Encounter: Payer: Self-pay | Admitting: Emergency Medicine

## 2022-03-16 DIAGNOSIS — I10 Essential (primary) hypertension: Secondary | ICD-10-CM | POA: Insufficient documentation

## 2022-03-16 DIAGNOSIS — R1110 Vomiting, unspecified: Secondary | ICD-10-CM | POA: Insufficient documentation

## 2022-03-16 DIAGNOSIS — R1030 Lower abdominal pain, unspecified: Secondary | ICD-10-CM | POA: Insufficient documentation

## 2022-03-16 LAB — COMPREHENSIVE METABOLIC PANEL
ALT: 20 U/L (ref 0–44)
AST: 17 U/L (ref 15–41)
Albumin: 4.4 g/dL (ref 3.5–5.0)
Alkaline Phosphatase: 77 U/L (ref 38–126)
Anion gap: 6 (ref 5–15)
BUN: 11 mg/dL (ref 6–20)
CO2: 22 mmol/L (ref 22–32)
Calcium: 9.6 mg/dL (ref 8.9–10.3)
Chloride: 112 mmol/L — ABNORMAL HIGH (ref 98–111)
Creatinine, Ser: 0.82 mg/dL (ref 0.61–1.24)
GFR, Estimated: >60 mL/min (ref >60)
Glucose, Bld: 93 mg/dL (ref 70–99)
Potassium: 3.5 mmol/L (ref 3.5–5.1)
Sodium: 140 mmol/L (ref 135–145)
Total Bilirubin: 0.4 mg/dL (ref 0.3–1.2)
Total Protein: 8 g/dL (ref 6.5–8.1)

## 2022-03-16 LAB — URINALYSIS, ROUTINE W REFLEX MICROSCOPIC
Bacteria, UA: NONE SEEN
Bilirubin Urine: NEGATIVE
Glucose, UA: 150 mg/dL — AB
Ketones, ur: NEGATIVE mg/dL
Leukocytes,Ua: NEGATIVE
Nitrite: NEGATIVE
Protein, ur: NEGATIVE mg/dL
Specific Gravity, Urine: 1.024 (ref 1.005–1.030)
Squamous Epithelial / LPF: NONE SEEN (ref 0–5)
pH: 5 (ref 5.0–8.0)

## 2022-03-16 LAB — CBC
HCT: 45.9 % (ref 39.0–52.0)
Hemoglobin: 15.4 g/dL (ref 13.0–17.0)
MCH: 31.8 pg (ref 26.0–34.0)
MCHC: 33.6 g/dL (ref 30.0–36.0)
MCV: 94.6 fL (ref 80.0–100.0)
Platelets: 249 10*3/uL (ref 150–400)
RBC: 4.85 MIL/uL (ref 4.22–5.81)
RDW: 12.9 % (ref 11.5–15.5)
WBC: 7.1 10*3/uL (ref 4.0–10.5)
nRBC: 0 % (ref 0.0–0.2)

## 2022-03-16 LAB — LIPASE, BLOOD: Lipase: 37 U/L (ref 11–51)

## 2022-03-16 MED ORDER — IOHEXOL 300 MG/ML  SOLN
100.0000 mL | Freq: Once | INTRAMUSCULAR | Status: AC | PRN
  Administered 2022-03-16: 100 mL via INTRAVENOUS

## 2022-03-16 MED ORDER — MORPHINE SULFATE (PF) 4 MG/ML IV SOLN
4.0000 mg | Freq: Once | INTRAVENOUS | Status: AC
  Administered 2022-03-16: 4 mg via INTRAVENOUS
  Filled 2022-03-16: qty 1

## 2022-03-16 MED ORDER — ONDANSETRON 4 MG PO TBDP: 4.0000 mg | ORAL_TABLET | Freq: Three times a day (TID) | ORAL | 0 refills | Status: AC | PRN

## 2022-03-16 MED ORDER — ONDANSETRON HCL 4 MG/2ML IJ SOLN
4.0000 mg | Freq: Once | INTRAMUSCULAR | Status: AC
  Administered 2022-03-16: 4 mg via INTRAVENOUS
  Filled 2022-03-16: qty 2

## 2022-03-16 MED ORDER — DICYCLOMINE HCL 10 MG PO CAPS: 10.0000 mg | ORAL_CAPSULE | Freq: Three times a day (TID) | ORAL | 0 refills | Status: AC

## 2022-03-16 NOTE — ED Triage Notes (Signed)
Pt complains of mid abd pain started 3 days ago.   chest pain and vomiting started today after eating biscuit and steak.

## 2022-03-16 NOTE — ED Provider Triage Note (Addendum)
Emergency Medicine Provider Triage Evaluation Note  Kevin Holt, a 51 y.o. male  was evaluated in triage.  Pt complains of lower abdominal discomfort, diarrhea, nausea and vomiting.  With history of hypertension and chronic low back pain, presents with 3 days of symptoms.  He notes watery diarrhea and crampy lower abdominal discomfort.  He also notes chills denies any frank fevers.  He denies any hematochezia, hematemesis, or melanotic stools.  Review of Systems  Positive: Lower abd pain, NVD Negative: FCS  Physical Exam  BP (!) 153/97 (BP Location: Left Arm)   Pulse 71   Temp 98.6 F (37 C) (Oral)   Resp 20   Ht 5' 9.5" (1.765 m)   Wt 99.8 kg   SpO2 95%   BMI 32.02 kg/m  Gen:   Awake, no distress  NAD Resp:  Normal effort CTA MSK:   Moves extremities without difficulty  Other:  Soft, nontender  Medical Decision Making  Medically screening exam initiated at 1:07 PM.  Appropriate orders placed.  JERMARION POFFENBERGER was informed that the remainder of the evaluation will be completed by another provider, this initial triage assessment does not replace that evaluation, and the importance of remaining in the ED until their evaluation is complete.  Patient to the ED for evaluation of lower abdominal discomfort, and N/V/D.   Lissa Hoard, PA-C 03/16/22 1308    Lissa Hoard, [place] 03/16/22 1308

## 2022-03-16 NOTE — ED Triage Notes (Signed)
Pt states diarrhea started 2 days ago.

## 2022-03-16 NOTE — Discharge Instructions (Addendum)
Take Zofran and Bentyl as directed for abdominal spasms and nausea.

## 2022-03-16 NOTE — ED Provider Notes (Signed)
[facility] Provider Note  Patient Contact: 4:14 PM (approximate)   History   Abdominal Pain and Emesis   HPI  Kevin Holt is a 51 y.o. male with a history of hypertension and chronic back pain, presents to the emergency department with 3 days of bilateral lower abdominal pain, chills and vomiting.  He reports that pain seems to radiate to his back in the scrotum.  No prior history of nephrolithiasis.  No associated diarrhea.  States he has never experienced similar symptoms in the past.  Never had any abdominal surgeries in the past.  No chest pain, chest tightness or shortness of breath.      Physical Exam   Triage Vital Signs: ED Triage Vitals  Enc Vitals Group     BP 03/16/22 1305 (!) 153/97     Pulse Rate 03/16/22 1305 71     Resp 03/16/22 1305 20     Temp 03/16/22 1305 98.6 F (37 C)     Temp Source 03/16/22 1305 Oral     SpO2 03/16/22 1305 95 %     Weight 03/16/22 1305 220 lb (99.8 kg)     Height 03/16/22 1305 5' 9.5" (1.765 m)     Head Circumference --      Peak Flow --      Pain Score 03/16/22 1325 9     Pain Loc --      Pain Edu? --      Excl. in GC? --     Most recent vital signs: Vitals:   03/16/22 1650 03/16/22 1740  BP: (!) 150/80 (!) 147/83  Pulse: 70 77  Resp: 20 20  Temp: 98 F (36.7 C)   SpO2: 95% 97%     General: Alert and in no acute distress. Eyes:  PERRL. EOMI. Head: No acute traumatic findings ENT:      Nose: No congestion/rhinnorhea.      Mouth/Throat: Mucous membranes are moist.  Neck: No stridor. No cervical spine tenderness to palpation. Cardiovascular:  Good peripheral perfusion Respiratory: Normal respiratory effort without tachypnea or retractions. Lungs CTAB. Good air entry to the bases with no decreased or absent breath sounds. Gastrointestinal: Bowel sounds 4 quadrants. Soft and tender to palpation. No guarding or rigidity. No palpable masses. No distention. No CVA tenderness. Musculoskeletal:  Full range of motion to all extremities.  Neurologic:  No gross focal neurologic deficits are appreciated.  Skin:   No rash noted    ED Results / Procedures / Treatments   Labs (all labs ordered are listed, but only abnormal results are displayed) Labs Reviewed  COMPREHENSIVE METABOLIC PANEL - Abnormal; Notable for the following components:      Result Value   Chloride 112 (*)    All other components within normal limits  URINALYSIS, ROUTINE W REFLEX MICROSCOPIC - Abnormal; Notable for the following components:   Color, Urine YELLOW (*)    APPearance CLEAR (*)    Glucose, UA 150 (*)    Hgb urine dipstick SMALL (*)    All other components within normal limits  LIPASE, BLOOD  CBC        RADIOLOGY  I personally viewed and evaluated these images as part of my medical decision making, as well as reviewing the written report by the radiologist.  ED Provider Interpretation: No acute abnormality in the abdomen or pelvis.   PROCEDURES:  Critical Care performed: No  Procedures   MEDICATIONS ORDERED IN ED: Medications  morphine (PF) 4  MG/ML injection 4 mg (4 mg Intravenous Given 03/16/22 1632)  ondansetron (ZOFRAN) injection 4 mg (4 mg Intravenous Given 03/16/22 1632)  iohexol (OMNIPAQUE) 300 MG/ML solution 100 mL (100 mLs Intravenous Contrast Given 03/16/22 1643)     IMPRESSION / MDM / ASSESSMENT AND PLAN / ED COURSE  I reviewed the triage vital signs and the nursing notes.                              Assessment and plan Abdominal pain 51 year old male with history of hypertension, presents to the emergency department with lower abdominal pain.  Patient was hypertensive at triage but vital signs were otherwise reassuring.  Patient was alert and nontoxic-appearing but was tender in the right and left lower abdominal quadrants.  CBC, CMP and lipase within range.  Urinalysis indicates a small amount of blood but no other concerning findings.  Will obtain CT abdomen  pelvis and will reassess.  IV morphine and Zofran given for pain and nausea. CT abdomen pelvis unremarkable.  Will prescribe patient Bentyl and Zofran for abdominal spasms and nausea.  Return precautions were given to return with new or worsening symptoms.     FINAL CLINICAL IMPRESSION(S) / ED DIAGNOSES   Final diagnoses:  Lower abdominal pain     Rx / DC Orders   ED Discharge Orders          Ordered    dicyclomine (BENTYL) 10 MG capsule  3 times daily before meals & bedtime        03/16/22 1722    ondansetron (ZOFRAN-ODT) 4 MG disintegrating tablet  Every 8 hours PRN        03/16/22 1722             Note:  This document was prepared using Dragon voice recognition software and may include unintentional dictation errors.   Pia Mau [place], PA-C 03/16/22 1959    Arnaldo Natal, MD 03/16/22 [phone]

## 2022-03-16 NOTE — ED Notes (Signed)
E signature pad not working. Pt educated on discharge instructions and verbalized understanding.  

## 2022-06-27 ENCOUNTER — Emergency Department
Admission: EM | Admit: 2022-06-27 | Discharge: 2022-06-27 | Disposition: A | Discharge status: Home / Self Care | Payer: Self-pay | Attending: Emergency Medicine | Admitting: Emergency Medicine

## 2022-06-27 ENCOUNTER — Other Ambulatory Visit: Payer: Self-pay

## 2022-06-27 ENCOUNTER — Emergency Department: Payer: Self-pay

## 2022-06-27 DIAGNOSIS — M25512 Pain in left shoulder: Secondary | ICD-10-CM | POA: Insufficient documentation

## 2022-06-27 DIAGNOSIS — I10 Essential (primary) hypertension: Secondary | ICD-10-CM | POA: Insufficient documentation

## 2022-06-27 DIAGNOSIS — G8911 Acute pain due to trauma: Secondary | ICD-10-CM | POA: Insufficient documentation

## 2022-06-27 DIAGNOSIS — X509XXA Other and unspecified overexertion or strenuous movements or postures, initial encounter: Secondary | ICD-10-CM | POA: Insufficient documentation

## 2022-06-27 NOTE — ED Provider Notes (Signed)
[facility] Provider Note    Event Date/Time   First MD Initiated Contact with Patient 06/27/22 1205     (approximate)   History   Shoulder Injury   HPI  Kevin Holt is a 51 y.o. male with a past medical history of hypertension and chronic back pain who presents today for evaluation of left shoulder pain x2 days.  Patient reports that he was moving a table 2 days ago and threw it over his left shoulder and felt a pop in his shoulder and has had pain since then.  He reports that he is only able to abduct his shoulder to 90 degrees and forward flex to 90 degrees.  No numbness or tingling.  No neck pain.  There are no problems to display for this patient.         Physical Exam   Triage Vital Signs: ED Triage Vitals  Enc Vitals Group     BP 06/27/22 1104 130/76     Pulse Rate 06/27/22 1104 66     Resp 06/27/22 1104 16     Temp 06/27/22 1104 98.4 F (36.9 C)     Temp src --      SpO2 06/27/22 1104 98 %     Weight 06/27/22 1101 215 lb (97.5 kg)     Height 06/27/22 1101 5\' 9"  (1.753 m)     Head Circumference --      Peak Flow --      Pain Score 06/27/22 1101 9     Pain Loc --      Pain Edu? --      Excl. in GC? --     Most recent vital signs: Vitals:   06/27/22 1104  BP: 130/76  Pulse: 66  Resp: 16  Temp: 98.4 F (36.9 C)  SpO2: 98%    Physical Exam Vitals and nursing note reviewed.  Constitutional:      General: Awake and alert. No acute distress.    Appearance: Normal appearance. The patient is normal weight.  HENT:     Head: Normocephalic and atraumatic.     Mouth: Mucous membranes are moist.  Eyes:     General: PERRL. Normal EOMs        Right eye: No discharge.        Left eye: No discharge.     Conjunctiva/sclera: Conjunctivae normal.  Cardiovascular:     Rate and Rhythm: Normal rate and regular rhythm.     Pulses: Normal pulses.     Heart sounds: Normal heart sounds Pulmonary:     Effort: Pulmonary effort is normal.  No respiratory distress.     Breath sounds: Normal breath sounds.  Abdominal:     Abdomen is soft. There is no abdominal tenderness. No rebound or guarding. No distention. Musculoskeletal:        General: No swelling. Normal range of motion.     Cervical back: Normal range of motion and neck supple.  No midline cervical spine tenderness.  Full range of motion of neck.  Negative Spurling test.  Negative Lhermitte sign.  Normal strength and sensation in bilateral upper extremities. Normal grip strength bilaterally.  Normal intrinsic muscle function of the hand bilaterally.  Normal radial pulses bilaterally. Left shoulder: No obvious deformity, swelling, ecchymosis, or erythema.  Anterior joint line tenderness present No clavicular or AC joint tenderness Able to actively and passively forward flex and abduct at shoulder to 90 degrees and then has pain with further  movement, negative drop arm test Pain with Obriens, SLAP, empty can, and lift off tests Normal internal and external rotation against resistance Pain with Hawkins and Neers Normal ROM at elbow and wrist Normal resisted pronation and supination 2+ radial pulse Normal grip strength Normal intrinsic hand muscle function Skin:    General: Skin is warm and dry.     Capillary Refill: Capillary refill takes less than 2 seconds.     Findings: No rash.  Neurological:     Mental Status: The patient is awake and alert.      ED Results / Procedures / Treatments   Labs (all labs ordered are listed, but only abnormal results are displayed) Labs Reviewed - No data to display   EKG     RADIOLOGY I independently reviewed and interpreted imaging and agree with radiologists findings.     PROCEDURES:  Critical Care performed:   Procedures   MEDICATIONS ORDERED IN ED: Medications - No data to display   IMPRESSION / MDM / ASSESSMENT AND PLAN / ED COURSE  I reviewed the triage vital signs and the nursing  notes.   Differential diagnosis includes, but is not limited to, rotator cuff injury, tear, fracture, less likely dislocation.  Patient is awake and alert, hemodynamically stable and neurovascularly intact.  He has limited range of motion of his shoulder secondary to pain, the negative drop arm test, I do not suspect complete rotator cuff tear.  X-ray obtained in triage is negative for fracture, dislocation, or effusion.  There is no warmth or erythema to the shoulder, I do not suspect septic joint.  No wounds noted.  Patient has easily reproducible pain with movement of his shoulder.  I suspect rotator cuff injury.  He was given a sling for comfort per her request, though he was advised of the risk of adhesive capsulitis and we discussed gentle range of motion exercises to help prevent this.  She was instructed to follow-up with orthopedics and the appropriate information was provided.  We discussed strict return precautions and the importance of close outpatient follow-up.  Patient was given a work note as requested.  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  Patient understands and agrees with plan.  He was discharged in stable condition.  Patient's presentation is most consistent with acute complicated illness / injury requiring diagnostic workup.     FINAL CLINICAL IMPRESSION(S) / ED DIAGNOSES   Final diagnoses:  Acute pain of left shoulder     Rx / DC Orders   ED Discharge Orders     None        Note:  This document was prepared using Dragon voice recognition software and may include unintentional dictation errors.   Keturah Shavers 06/27/22 1319    Jene Every, MD 06/27/22 1329

## 2022-06-27 NOTE — ED Triage Notes (Signed)
Pt reports Thursday was moving a table and threw it over his left shoulder and felt a pop in his shoulder and has not been able to move it well since then.

## 2022-06-27 NOTE — Discharge Instructions (Addendum)
Your x-ray was normal.  It is possible that you have injured your rotator cuff.  Please schedule appointment with the orthopedist.  You may wear the sling to help with comfort, but remember to take your arm out of the sling several times per day to perform gentle range of motion exercises as we discussed.  Please return for any new, worsening, or change in symptoms or other concerns.

## 2023-09-30 ENCOUNTER — Ambulatory Visit (INDEPENDENT_AMBULATORY_CARE_PROVIDER_SITE_OTHER): Payer: Self-pay | Admitting: Nurse Practitioner

## 2023-09-30 DIAGNOSIS — Z91199 Patient's noncompliance with other medical treatment and regimen due to unspecified reason: Secondary | ICD-10-CM

## 2023-09-30 NOTE — Progress Notes (Deleted)
 Established Patient Office Visit  Subjective:  Patient ID: Kevin Holt, male    DOB: [birth year]  Age: 53 y.o. MRN: [id number]  CC: No chief complaint on file.  Patient did not show for the scheduled appointment. HPI  Kevin Holt presents for:  HPI   Past Medical History:  Diagnosis Date   Chronic back pain    Hypertension     No past surgical history on file.  No family history on file.  Social History   Socioeconomic History   Marital status: Single    Spouse name: Not on file   Number of children: Not on file   Years of education: Not on file   Highest education level: Not on file  Occupational History   Not on file  Tobacco Use   Smoking status: Never   Smokeless tobacco: Never  Substance and Sexual Activity   Alcohol use: Not Currently    Comment: occasional   Drug use: Yes    Types: Marijuana    Comment: smoked a joint 5 days ago   Sexual activity: Not on file  Other Topics Concern   Not on file  Social History Narrative   Not on file   Social Drivers of Health   Financial Resource Strain: Not on file  Food Insecurity: Not on file  Transportation Needs: Not on file  Physical Activity: Not on file  Stress: Not on file  Social Connections: Not on file  Intimate Partner Violence: Not on file     Outpatient Medications Prior to Visit  Medication Sig Dispense Refill   azithromycin (ZITHROMAX Z-PAK) 250 MG tablet 2 pills today then 1 pill a day for 4 days 6 each 0   dicyclomine (BENTYL) 10 MG capsule Take 1 capsule (10 mg total) by mouth 4 (four) times daily -  before meals and at bedtime for 5 days. 20 capsule 0   fluticasone (FLONASE) 50 MCG/ACT nasal spray Place 2 sprays into both nostrils daily. 16 g 2   No facility-administered medications prior to visit.    Allergies  Allergen Reactions   Tramadol     ROS Review of Systems Negative unless indicated in HPI.    Objective:    Physical Exam  There were no vitals taken for  this visit. Wt Readings from Last 3 Encounters:  06/27/22 215 lb (97.5 kg)  03/16/22 220 lb (99.8 kg)  05/30/21 212 lb (96.2 kg)     Health Maintenance  Topic Date Due   HIV Screening  Never done   Hepatitis C Screening  Never done   DTaP/Tdap/Td (1 - Tdap) Never done   Colonoscopy  Never done   Zoster Vaccines- Shingrix (1 of 2) Never done   COVID-19 Vaccine (1 - 2024-25 season) 10/15/2023 (Originally 03/21/2023)   INFLUENZA VACCINE  10/18/2023 (Originally 02/18/2023)   HPV VACCINES  Aged Out    There are no preventive care reminders to display for this patient.  No results found for: "TSH" Lab Results  Component Value Date   WBC 7.1 03/16/2022   HGB 15.4 03/16/2022   HCT 45.9 03/16/2022   MCV 94.6 03/16/2022   PLT 249 03/16/2022   Lab Results  Component Value Date   NA 140 03/16/2022   K 3.5 03/16/2022   CO2 22 03/16/2022   GLUCOSE 93 03/16/2022   BUN 11 03/16/2022   CREATININE 0.82 03/16/2022   BILITOT 0.4 03/16/2022   ALKPHOS 77 03/16/2022   AST 17 03/16/2022  ALT 20 03/16/2022   PROT 8.0 03/16/2022   ALBUMIN 4.4 03/16/2022   CALCIUM 9.6 03/16/2022   ANIONGAP 6 03/16/2022   No results found for: "CHOL" No results found for: "HDL" No results found for: "LDLCALC" No results found for: "TRIG" No results found for: "CHOLHDL" No results found for: "HGBA1C"    Assessment & Plan:  No-show for appointment    Follow-up: No follow-ups on file.   Kara Dies, NP

## 2023-09-30 NOTE — Progress Notes (Signed)
 Patient did not show for the scheduled new patient appointment.

## 2024-01-13 ENCOUNTER — Other Ambulatory Visit: Payer: Self-pay

## 2024-01-13 ENCOUNTER — Emergency Department
Admission: EM | Admit: 2024-01-13 | Discharge: 2024-01-13 | Discharge status: Against Medical Advice | Attending: Emergency Medicine | Admitting: Emergency Medicine

## 2024-01-13 ENCOUNTER — Encounter: Payer: Self-pay | Admitting: *Deleted

## 2024-01-13 ENCOUNTER — Emergency Department

## 2024-01-13 DIAGNOSIS — R200 Anesthesia of skin: Secondary | ICD-10-CM | POA: Diagnosis not present

## 2024-01-13 DIAGNOSIS — Z5321 Procedure and treatment not carried out due to patient leaving prior to being seen by health care provider: Secondary | ICD-10-CM | POA: Diagnosis not present

## 2024-01-13 DIAGNOSIS — R0789 Other chest pain: Secondary | ICD-10-CM | POA: Diagnosis not present

## 2024-01-13 DIAGNOSIS — R197 Diarrhea, unspecified: Secondary | ICD-10-CM | POA: Diagnosis not present

## 2024-01-13 LAB — CBC
HCT: 54.3 % — ABNORMAL HIGH (ref 39.0–52.0)
Hemoglobin: 18.6 g/dL — ABNORMAL HIGH (ref 13.0–17.0)
MCH: 31.3 pg (ref 26.0–34.0)
MCHC: 34.3 g/dL (ref 30.0–36.0)
MCV: 91.4 fL (ref 80.0–100.0)
Platelets: 314 10*3/uL (ref 150–400)
RBC: 5.94 MIL/uL — ABNORMAL HIGH (ref 4.22–5.81)
RDW: 12.4 % (ref 11.5–15.5)
WBC: 10.2 10*3/uL (ref 4.0–10.5)
nRBC: 0 % (ref 0.0–0.2)

## 2024-01-13 LAB — URINALYSIS, ROUTINE W REFLEX MICROSCOPIC
Bilirubin Urine: NEGATIVE
Glucose, UA: >=500 mg/dL — AB
Hgb urine dipstick: NEGATIVE
Ketones, ur: NEGATIVE mg/dL
Leukocytes,Ua: NEGATIVE
Nitrite: NEGATIVE
Protein, ur: 100 mg/dL — AB
Specific Gravity, Urine: 1.032 — ABNORMAL HIGH (ref 1.005–1.030)
pH: 5 (ref 5.0–8.0)

## 2024-01-13 LAB — BASIC METABOLIC PANEL WITH GFR
Anion gap: 11 (ref 5–15)
BUN: 11 mg/dL (ref 6–20)
CO2: 20 mmol/L — ABNORMAL LOW (ref 22–32)
Calcium: 8.6 mg/dL — ABNORMAL LOW (ref 8.9–10.3)
Chloride: 109 mmol/L (ref 98–111)
Creatinine, Ser: 1.12 mg/dL (ref 0.61–1.24)
GFR, Estimated: >60 mL/min (ref >60)
Glucose, Bld: 213 mg/dL — ABNORMAL HIGH (ref 70–99)
Potassium: 3.6 mmol/L (ref 3.5–5.1)
Sodium: 140 mmol/L (ref 135–145)

## 2024-01-13 LAB — MAGNESIUM: Magnesium: 2.1 mg/dL (ref 1.7–2.4)

## 2024-01-13 LAB — LIPASE, BLOOD: Lipase: 43 U/L (ref 11–51)

## 2024-01-13 LAB — TROPONIN I (HIGH SENSITIVITY): Troponin I (High Sensitivity): 4 ng/L (ref <18)

## 2024-01-13 NOTE — ED Notes (Signed)
 No answer when called from the lobby x3

## 2024-01-13 NOTE — ED Triage Notes (Signed)
 Pt ambulatory to triage.  Pt has chest pain for 2 days.  Pt has left side pain in chest with numbness in hands.    Diarrhea since yesterday.  Vomited today.  Pt also has sob.  Pt alert  speech clear.

## 2024-05-26 ENCOUNTER — Ambulatory Visit: Admitting: Nurse Practitioner

## 2024-06-26 ENCOUNTER — Emergency Department

## 2024-06-26 ENCOUNTER — Emergency Department
Admission: EM | Admit: 2024-06-26 | Discharge: 2024-06-27 | Disposition: A | Attending: Emergency Medicine | Admitting: Emergency Medicine

## 2024-06-26 ENCOUNTER — Other Ambulatory Visit: Payer: Self-pay

## 2024-06-26 DIAGNOSIS — S0990XA Unspecified injury of head, initial encounter: Secondary | ICD-10-CM

## 2024-06-26 DIAGNOSIS — R55 Syncope and collapse: Secondary | ICD-10-CM

## 2024-06-26 LAB — CBC WITH DIFFERENTIAL/PLATELET
Abs Immature Granulocytes: 0.07 K/uL (ref 0.00–0.07)
Basophils Absolute: 0.1 K/uL (ref 0.0–0.1)
Basophils Relative: 1 %
Eosinophils Absolute: 0.1 K/uL (ref 0.0–0.5)
Eosinophils Relative: 1 %
HCT: 43 % (ref 39.0–52.0)
Hemoglobin: 15.1 g/dL (ref 13.0–17.0)
Immature Granulocytes: 1 %
Lymphocytes Relative: 16 %
Lymphs Abs: 1.9 K/uL (ref 0.7–4.0)
MCH: 32.1 pg (ref 26.0–34.0)
MCHC: 35.1 g/dL (ref 30.0–36.0)
MCV: 91.5 fL (ref 80.0–100.0)
Monocytes Absolute: 0.9 K/uL (ref 0.1–1.0)
Monocytes Relative: 8 %
Neutro Abs: 8.4 K/uL — ABNORMAL HIGH (ref 1.7–7.7)
Neutrophils Relative %: 73 %
Platelets: 261 K/uL (ref 150–400)
RBC: 4.7 MIL/uL (ref 4.22–5.81)
RDW: 12.4 % (ref 11.5–15.5)
WBC: 11.4 K/uL — ABNORMAL HIGH (ref 4.0–10.5)
nRBC: 0 % (ref 0.0–0.2)

## 2024-06-26 MED ORDER — SODIUM CHLORIDE 0.9 % IV BOLUS
500.0000 mL | Freq: Once | INTRAVENOUS | Status: AC
  Administered 2024-06-26: 500 mL via INTRAVENOUS

## 2024-06-26 NOTE — ED Provider Notes (Signed)
 [facility] Provider Note    Event Date/Time   First MD Initiated Contact with Patient 06/26/24 2318     (approximate)   History   Loss of Consciousness (53 y/o M, BIBA from home for syncopal episode while walking to the front door to look at the snow falling.  Denies thinners, +LOC, pt reports he did hit the front of his forehead during the fall.  Skin intact.)   HPI Kevin Holt is a 53 y.o. male who presents by EMS after a syncopal episode.  He says that he does not drink regularly but he had several shots of tequila tonight and has been smoking marijuana.  He went to the front door to look and see if the snow was falling and apparently passed out briefly when he stood up, which caused him to fall and strike the right front part of his forehead on something.  He woke up immediately and said he feels fine but his family was worried about him.  He has a little bit of pain in his head and the back of his neck but he said he always has neck pain.  He has no numbness or weakness in his extremities and has no chest pain or shortness of breath.  He has had no nausea or vomiting.  EMS reports that he was normotensive and in fact a bit hypertensive, and at no point was he hypotensive nor has he had any additional symptoms.     Physical Exam   ED Triage Vitals  Encounter Vitals Group     BP 06/26/24 2320 (!) 128/92     Girls Systolic BP Percentile --      Girls Diastolic BP Percentile --      Boys Systolic BP Percentile --      Boys Diastolic BP Percentile --      Pulse Rate 06/26/24 2320 75     Resp --      Temp 06/26/24 2320 97.8 F (36.6 C)     Temp Source 06/26/24 2320 Oral     SpO2 06/26/24 2320 96 %     Weight 06/26/24 2321 102 kg (224 lb 13.9 oz)     Height 06/26/24 2321 1.765 m (5' 9.5)     Head Circumference --      Peak Flow --      Pain Score 06/26/24 2321 3     Pain Loc --      Pain Education --      Exclude from Growth Chart --       Most  recent vital signs: Vitals:   06/26/24 2320  Pulse: 75  Temp: 97.8 F (36.6 C)  SpO2: 96%    General: Awake, no distress.  Pleasant, conversant, laughing and joking with me.  No visible signs of trauma to his forehead where he said that he hit his head. CV:  Good peripheral perfusion.  Regular rate and rhythm. Resp:  Normal effort. Speaking easily and comfortably, no accessory muscle usage nor intercostal retractions.   Abd:  No distention.   Other:  patient has some mild tenderness to palpation of the cervical spine as well as with flexion, extension, and rotation of his head and neck from side-to-side, but he said this is normal for him.     ED Results / Procedures / Treatments   Labs (all labs ordered are listed, but only abnormal results are displayed) Labs Reviewed  BASIC METABOLIC PANEL WITH GFR  CBC  WITH DIFFERENTIAL/PLATELET     EKG  ED ECG REPORT I, Darleene Dome, the attending physician, personally viewed and interpreted this ECG.  Date: 06/26/2024 EKG Time: 23: 34 Rate: 66 Rhythm: normal sinus rhythm QRS Axis: normal Intervals: normal ST/T Wave abnormalities: normal Narrative Interpretation: no evidence of acute ischemia    RADIOLOGY ***   PROCEDURES:  Critical Care performed: No  .1-3 Lead EKG Interpretation  Performed by: Dome Darleene, MD Authorized by: Dome Darleene, MD     Interpretation: normal     ECG rate:  75   ECG rate assessment: normal     Rhythm: sinus rhythm     Ectopy: none     Conduction: normal       IMPRESSION / MDM / ASSESSMENT AND PLAN / ED COURSE  I reviewed the triage vital signs and the nursing notes.                              Differential diagnosis includes, but is not limited to, vasovagal orthostatic syncope likely secondary to intoxication, metabolic or electrolyte abnormality, less likely cardiogenic syncope or CVA.  Patient's presentation is most consistent with acute presentation with potential threat to  life or bodily function.  Labs/studies ordered: CBC with differential, BMP, EKG, CT head, CT cervical spine  Interventions/Medications given:  Medications - No data to display  (Note:  hospital course my include additional interventions and/or labs/studies not listed above.)   Patient well-appearing and in no distress.  He admits to alcohol intoxication and marijuana use and the marijuana use is very obvious when walking in the room.  There is no point in checking a urine drug screen nor ethanol level given his open and honest history.  We will perform a standard workup but he is having no chest pain or shortness of breath and has a reassuring initial exam.  NIH stroke scale is 0 with no appreciable focal neurological deficits.  However, given his intoxication, I cannot rule him out with Nexus criteria and he does have some neck pain so I will proceed with CT head and CT cervical spine as part of the screening exam.  Patient is comfortable with the plan for discharge if his workup is reassuring.  He is low risk syncope based on [place] syncope rule  The patient is on the cardiac monitor to evaluate for evidence of arrhythmia and/or significant heart rate changes.       FINAL CLINICAL IMPRESSION(S) / ED DIAGNOSES   Final diagnoses:  None     Rx / DC Orders   ED Discharge Orders     None        Note:  This document was prepared using Dragon voice recognition software and may include unintentional dictation errors.

## 2024-06-27 LAB — BASIC METABOLIC PANEL WITH GFR
Anion gap: 13 (ref 5–15)
BUN: 12 mg/dL (ref 6–20)
CO2: 23 mmol/L (ref 22–32)
Calcium: 9.2 mg/dL (ref 8.9–10.3)
Chloride: 103 mmol/L (ref 98–111)
Creatinine, Ser: 0.88 mg/dL (ref 0.61–1.24)
GFR, Estimated: >60 mL/min (ref >60)
Glucose, Bld: 113 mg/dL — ABNORMAL HIGH (ref 70–99)
Potassium: 4.1 mmol/L (ref 3.5–5.1)
Sodium: 138 mmol/L (ref 135–145)

## 2024-06-27 NOTE — Discharge Instructions (Signed)
 You have been seen today in the Emergency Department (ED)  for syncope (passing out).  Your workup including labs and EKG show reassuring results.  Your symptoms may be due to dehydration, so it is important that you drink plenty of non-alcoholic fluids (alcohol and marijuana probably played a large role in your collapse).    Rehydrate and avoid any additional substances over the next few days.  Return to the Emergency Department (ED)  if you have any further syncopal episodes (pass out again) or develop ANY chest pain, pressure, tightness, trouble breathing, sudden sweating, or other symptoms that concern you.

## 2024-08-10 ENCOUNTER — Other Ambulatory Visit: Payer: Self-pay

## 2024-08-10 ENCOUNTER — Emergency Department

## 2024-08-10 ENCOUNTER — Emergency Department
Admission: EM | Admit: 2024-08-10 | Discharge: 2024-08-10 | Disposition: A | Discharge status: Home / Self Care | Attending: Emergency Medicine | Admitting: Emergency Medicine

## 2024-08-10 DIAGNOSIS — K429 Umbilical hernia without obstruction or gangrene: Secondary | ICD-10-CM | POA: Insufficient documentation

## 2024-08-10 DIAGNOSIS — I10 Essential (primary) hypertension: Secondary | ICD-10-CM | POA: Insufficient documentation

## 2024-08-10 DIAGNOSIS — R109 Unspecified abdominal pain: Secondary | ICD-10-CM | POA: Diagnosis present

## 2024-08-10 LAB — CBC
HCT: 46 % (ref 39.0–52.0)
Hemoglobin: 15.7 g/dL (ref 13.0–17.0)
MCH: 32.4 pg (ref 26.0–34.0)
MCHC: 34.1 g/dL (ref 30.0–36.0)
MCV: 95 fL (ref 80.0–100.0)
Platelets: 220 K/uL (ref 150–400)
RBC: 4.84 MIL/uL (ref 4.22–5.81)
RDW: 13.1 % (ref 11.5–15.5)
WBC: 7.3 K/uL (ref 4.0–10.5)
nRBC: 0 % (ref 0.0–0.2)

## 2024-08-10 LAB — URINALYSIS, ROUTINE W REFLEX MICROSCOPIC
Bacteria, UA: NONE SEEN
Bilirubin Urine: NEGATIVE
Glucose, UA: NEGATIVE mg/dL
Hgb urine dipstick: NEGATIVE
Ketones, ur: NEGATIVE mg/dL
Nitrite: NEGATIVE
Protein, ur: NEGATIVE mg/dL
Specific Gravity, Urine: 1.031 — ABNORMAL HIGH (ref 1.005–1.030)
Squamous Epithelial / HPF: 0 /HPF (ref 0–5)
pH: 5 (ref 5.0–8.0)

## 2024-08-10 LAB — COMPREHENSIVE METABOLIC PANEL WITH GFR
ALT: 21 U/L (ref 0–44)
AST: 19 U/L (ref 15–41)
Albumin: 4.1 g/dL (ref 3.5–5.0)
Alkaline Phosphatase: 71 U/L (ref 38–126)
Anion gap: 10 (ref 5–15)
BUN: 9 mg/dL (ref 6–20)
CO2: 23 mmol/L (ref 22–32)
Calcium: 8.9 mg/dL (ref 8.9–10.3)
Chloride: 108 mmol/L (ref 98–111)
Creatinine, Ser: 0.85 mg/dL (ref 0.61–1.24)
GFR, Estimated: >60 mL/min
Glucose, Bld: 94 mg/dL (ref 70–99)
Potassium: 4.2 mmol/L (ref 3.5–5.1)
Sodium: 141 mmol/L (ref 135–145)
Total Bilirubin: 0.3 mg/dL (ref 0.0–1.2)
Total Protein: 6.5 g/dL (ref 6.5–8.1)

## 2024-08-10 LAB — LACTIC ACID, PLASMA: Lactic Acid, Venous: 1.4 mmol/L (ref 0.5–1.9)

## 2024-08-10 LAB — LIPASE, BLOOD: Lipase: 49 U/L (ref 11–51)

## 2024-08-10 MED ORDER — NAPROXEN 500 MG PO TABS: 500.0000 mg | ORAL_TABLET | Freq: Two times a day (BID) | ORAL | 0 refills | Status: AC

## 2024-08-10 MED ORDER — NAPROXEN 500 MG PO TABS
500.0000 mg | ORAL_TABLET | Freq: Once | ORAL | Status: AC
  Administered 2024-08-10: 500 mg via ORAL
  Filled 2024-08-10: qty 1

## 2024-08-10 MED ORDER — OXYCODONE-ACETAMINOPHEN 5-325 MG PO TABS
1.0000 | ORAL_TABLET | Freq: Once | ORAL | Status: AC
  Administered 2024-08-10: 1 via ORAL
  Filled 2024-08-10: qty 1

## 2024-08-10 MED ORDER — IOHEXOL 300 MG/ML  SOLN
100.0000 mL | Freq: Once | INTRAMUSCULAR | Status: AC | PRN
  Administered 2024-08-10: 100 mL via INTRAVENOUS

## 2024-08-10 MED ORDER — OXYCODONE-ACETAMINOPHEN 5-325 MG PO TABS: 1.0000 | ORAL_TABLET | Freq: Four times a day (QID) | ORAL | 0 refills | Status: AC | PRN

## 2024-08-10 NOTE — ED Provider Notes (Signed)
 "  [facility] Provider Note    Event Date/Time   First MD Initiated Contact with Patient 08/10/24 1915     (approximate)   History   Abdominal Pain   HPI  Kevin Holt is a 54 y.o. male with history of hypertension, chronic umbilical hernia who comes the ED complaining of increased central abdominal pain which started worsening last night while doing strenuous activity helping a friend with car repair.  Then this morning, he did more strenuous activity putting up a fence and the pain became much worse.  He has had nausea, loss of appetite throughout the day.  Bowel movements have been normal.  No fever.     Physical Exam   Triage Vital Signs: ED Triage Vitals [08/10/24 1819]  Encounter Vitals Group     BP 137/82     Girls Systolic BP Percentile      Girls Diastolic BP Percentile      Boys Systolic BP Percentile      Boys Diastolic BP Percentile      Pulse Rate 82     Resp 18     Temp 98 F (36.7 C)     Temp Source Oral     SpO2 95 %     Weight      Height      Head Circumference      Peak Flow      Pain Score 6     Pain Loc      Pain Education      Exclude from Growth Chart     Most recent vital signs: Vitals:   08/10/24 1819  BP: 137/82  Pulse: 82  Resp: 18  Temp: 98 F (36.7 C)  SpO2: 95%    General: Awake, no distress.  CV:  Good peripheral perfusion.  Regular rate rhythm Resp:  Normal effort.  Clear lungs Abd:  Mild distention.  No peritonitis.  3 cm umbilical hernia which is firm and very tender to palpation, but reducible. Other:  Moist mucosa   ED Results / Procedures / Treatments   Labs (all labs ordered are listed, but only abnormal results are displayed) Labs Reviewed  URINALYSIS, ROUTINE W REFLEX MICROSCOPIC - Abnormal; Notable for the following components:      Result Value   Color, Urine YELLOW (*)    APPearance HAZY (*)    Specific Gravity, Urine 1.031 (*)    Leukocytes,Ua TRACE (*)    All other  components within normal limits  LIPASE, BLOOD  COMPREHENSIVE METABOLIC PANEL WITH GFR  CBC  LACTIC ACID, PLASMA     RADIOLOGY X-ray KUB interpreted by me, shows colonic gas and multiple dilated small bowel loops.  Radiology report reviewed   PROCEDURES:  Procedures   MEDICATIONS ORDERED IN ED: Medications  oxyCODONE -acetaminophen  (PERCOCET/ROXICET) 5-325 MG per tablet 1 tablet (1 tablet Oral Given 08/10/24 1947)  naproxen  (NAPROSYN ) tablet 500 mg (500 mg Oral Given 08/10/24 1947)  iohexol  (OMNIPAQUE ) 300 MG/ML solution 100 mL (100 mLs Intravenous Contrast Given 08/10/24 2058)     IMPRESSION / MDM / ASSESSMENT AND PLAN / ED COURSE  I reviewed the triage vital signs and the nursing notes.                              Differential diagnosis includes, but is not limited to, strained ventral hernia, incarcerated hernia, bowel obstruction, rectus abdominal strain  Patient presents with  abdominal pain, recent activity that likely would worsen an existing ventral hernia.  Hernia sac very tender to the touch.  KUB concerning for underlying SBO.  Will obtain CT.   ----------------------------------------- 10:36 PM on 08/10/2024 ----------------------------------------- CT unremarkable, no signs of obstruction or incarcerated hernia.  Discussed with general surgery Dr. Tye who agrees this is reassuring and patient findings follow-up outpatient.  Patient is tolerating oral intake.  Stable for discharge     FINAL CLINICAL IMPRESSION(S) / ED DIAGNOSES   Final diagnoses:  Umbilical hernia without obstruction and without gangrene     Rx / DC Orders   ED Discharge Orders          Ordered    naproxen  (NAPROSYN ) 500 MG tablet  2 times daily with meals        08/10/24 2232    oxyCODONE -acetaminophen  (PERCOCET) 5-325 MG tablet  Every 6 hours PRN        08/10/24 2235             Note:  This document was prepared using Dragon voice recognition software and may include  unintentional dictation errors.   Viviann Pastor, MD 08/10/24 2236  "

## 2024-08-10 NOTE — ED Triage Notes (Signed)
 Pt to ED via POV from home. Pt reports umbilical hernia that has been present and bulging for several years. Pt states yesterday was working on a car and put up a fence this morning and the pain at hernia site is worse. Pt also reports some nausea.

## 2024-09-15 ENCOUNTER — Ambulatory Visit: Admitting: Nurse Practitioner
